# Patient Record
Sex: Male | Born: 1985 | Race: White | Hispanic: No | Marital: Single | Smoking: Current every day smoker
Health system: Southern US, Community
[De-identification: ages and names within clinical notes are randomized; demographics above are authoritative.]

## PROBLEM LIST (undated history)

## (undated) HISTORY — PX: HERNIA REPAIR: SHX51

---
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
## 2011-05-18 ENCOUNTER — Emergency Department (HOSPITAL_COMMUNITY)
Admission: EM | Admit: 2011-05-18 | Discharge: 2011-05-19 | Disposition: A | Discharge status: Other Acute Inpatient Hospital | Payer: 59 | Attending: Emergency Medicine | Admitting: Emergency Medicine

## 2011-05-18 DIAGNOSIS — X789XXA Intentional self-harm by unspecified sharp object, initial encounter: Secondary | ICD-10-CM | POA: Insufficient documentation

## 2011-05-18 DIAGNOSIS — F1010 Alcohol abuse, uncomplicated: Secondary | ICD-10-CM | POA: Insufficient documentation

## 2011-05-18 DIAGNOSIS — F329 Major depressive disorder, single episode, unspecified: Secondary | ICD-10-CM | POA: Insufficient documentation

## 2011-05-18 DIAGNOSIS — S51809A Unspecified open wound of unspecified forearm, initial encounter: Secondary | ICD-10-CM | POA: Insufficient documentation

## 2011-05-18 DIAGNOSIS — F3289 Other specified depressive episodes: Secondary | ICD-10-CM | POA: Insufficient documentation

## 2011-05-18 LAB — CBC
HCT: 48.1 % (ref 39.0–52.0)
Hemoglobin: 17.4 g/dL — ABNORMAL HIGH (ref 13.0–17.0)
MCH: 31 pg (ref 26.0–34.0)
MCHC: 36.2 g/dL — ABNORMAL HIGH (ref 30.0–36.0)
MCV: 85.7 fL (ref 78.0–100.0)
Platelets: 294 10*3/uL (ref 150–400)
RBC: 5.61 MIL/uL (ref 4.22–5.81)
RDW: 12.7 % (ref 11.5–15.5)
WBC: 10.7 10*3/uL — ABNORMAL HIGH (ref 4.0–10.5)

## 2011-05-18 LAB — COMPREHENSIVE METABOLIC PANEL
ALT: 16 U/L (ref 0–53)
AST: 19 U/L (ref 0–37)
Albumin: 4.5 g/dL (ref 3.5–5.2)
Alkaline Phosphatase: 115 U/L (ref 39–117)
BUN: 11 mg/dL (ref 6–23)
CO2: 26 mEq/L (ref 19–32)
Calcium: 9.5 mg/dL (ref 8.4–10.5)
Chloride: 103 mEq/L (ref 96–112)
Creatinine, Ser: 0.94 mg/dL (ref 0.50–1.35)
GFR calc Af Amer: >60 mL/min (ref >60)
GFR calc non Af Amer: >60 mL/min (ref >60)
Glucose, Bld: 98 mg/dL (ref 70–99)
Potassium: 3.9 mEq/L (ref 3.5–5.1)
Sodium: 141 mEq/L (ref 135–145)
Total Bilirubin: 0.2 mg/dL — ABNORMAL LOW (ref 0.3–1.2)
Total Protein: 8.1 g/dL (ref 6.0–8.3)

## 2011-05-18 LAB — DIFFERENTIAL
Basophils Absolute: 0 10*3/uL (ref 0.0–0.1)
Basophils Relative: 0 % (ref 0–1)
Eosinophils Absolute: 0.2 10*3/uL (ref 0.0–0.7)
Eosinophils Relative: 2 % (ref 0–5)
Lymphocytes Relative: 33 % (ref 12–46)
Lymphs Abs: 3.6 10*3/uL (ref 0.7–4.0)
Monocytes Absolute: 0.8 10*3/uL (ref 0.1–1.0)
Monocytes Relative: 8 % (ref 3–12)
Neutro Abs: 6.1 10*3/uL (ref 1.7–7.7)
Neutrophils Relative %: 57 % (ref 43–77)

## 2011-05-18 LAB — ETHANOL: Alcohol, Ethyl (B): 264 mg/dL — ABNORMAL HIGH (ref 0–11)

## 2011-05-19 ENCOUNTER — Inpatient Hospital Stay (HOSPITAL_COMMUNITY)
Admission: AD | Admit: 2011-05-19 | Discharge: 2011-05-24 | DRG: 897 | Disposition: A | Discharge status: Home / Self Care | Payer: 59 | Source: Ambulatory Visit | Attending: Psychiatry | Admitting: Psychiatry

## 2011-05-19 DIAGNOSIS — X838XXA Intentional self-harm by other specified means, initial encounter: Secondary | ICD-10-CM

## 2011-05-19 DIAGNOSIS — S51809A Unspecified open wound of unspecified forearm, initial encounter: Secondary | ICD-10-CM

## 2011-05-19 DIAGNOSIS — F1994 Other psychoactive substance use, unspecified with psychoactive substance-induced mood disorder: Secondary | ICD-10-CM

## 2011-05-19 DIAGNOSIS — F1210 Cannabis abuse, uncomplicated: Secondary | ICD-10-CM

## 2011-05-19 DIAGNOSIS — F1020 Alcohol dependence, uncomplicated: Principal | ICD-10-CM

## 2011-05-19 DIAGNOSIS — F17200 Nicotine dependence, unspecified, uncomplicated: Secondary | ICD-10-CM

## 2011-05-19 DIAGNOSIS — Z560 Unemployment, unspecified: Secondary | ICD-10-CM

## 2011-05-19 LAB — RAPID URINE DRUG SCREEN, HOSP PERFORMED
Amphetamines: NOT DETECTED
Barbiturates: NOT DETECTED
Opiates: NOT DETECTED
Tetrahydrocannabinol: POSITIVE — AB

## 2011-05-20 DIAGNOSIS — F1020 Alcohol dependence, uncomplicated: Secondary | ICD-10-CM

## 2011-05-21 DIAGNOSIS — F1020 Alcohol dependence, uncomplicated: Secondary | ICD-10-CM

## 2011-05-26 NOTE — Assessment & Plan Note (Signed)
[name]            ACCOUNT NO.:  [id number]  MEDICAL RECORD NO.:  [id number]  LOCATION:                                FACILITY:  [facility]  PHYSICIAN:  Orson Aloe, MD       DATE OF BIRTH:  04/11/1986  DATE OF ADMISSION:  05/19/2011 DATE OF DISCHARGE:                      PSYCHIATRIC ADMISSION ASSESSMENT   This is a 25 year old, single white male.  He presented in the company of some family members to the emergency room at [facility].  He acknowledged that he has been using alcohol heavily.  He cut himself on both of his arms and his legs with a box cutter.  He states that he was not suicidal, but he had been drinking and he cut himself because he is "a failure."  He acknowledges that he does get out of control when drinking.  He does have blackouts.  People tell him later what he has done and he has no recollection of this.  He is also currently on unsupervised probation for a DUI.  He was given the DUI in 09/07/10, last December.  He was sentenced in February and his license was revoked. He is allowed to drive at all.  PAST PSYCHIATRIC HISTORY:  He was started at the [facility] in June, although he states he has not been going.  SOCIAL HISTORY:  He is a high Garment/textile technologist in 2008, graduated from the [address] [employer] [place].  He was supposed to have started a fall semester at [employer], but he missed the deadline.  He reports that right now he feels "crazy" because he is not earning any money.  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  He got fired from this as well.  He has had other under the table jobs in [employer] and he has worked in some capacity at work. Currently, he is living with his parents and has no income.  FAMILY HISTORY:  He denies alcohol and drug history.  He apparently has been using alcohol and marijuana since he was 17.  He states he  drinks three to four times a week.  Once he gets to liquor it is all over.  His liquor of choice is Vodka, but he normally starts out with beer. Primary care provider is none.  Medical problems:  He has no known problems.  MEDICATIONS:  He is not prescribed any.  DRUG ALLERGIES:  He has no known drug allergies.  Positive physical findings:  He was seen in the ED at [facility].  He was afebrile 98.3.  His pulse was 84-94, respirations were 14-22, blood pressure 118/77 to 133/76.  His alcohol level was exuberant at 264.  His WBC was elevated at 10.7, hemoglobin was concentrated at 17.4 as was hematocrit 48.1.  Urine drug screen was positive for marijuana and he had no other labs drawn.  He sustained seven stitches to his left forearm so the other is 14 or so stitches were on several cuts on his right forearm.  They are covered and I do not have a good  visual on that at the moment.  MENTAL STATUS EXAM:  He is alert and oriented.  He has a ring through his nose.  His speech is not pressured.  His mood is appropriate. Thought processes were clear, rational and goal oriented.  He realizes that he needs to straighten up.  He does not quite know how.  Judgment and insight are poor.  Concentration and memory are intact. Intelligence is at least average.  Axis I:  Diagnosis is substance abuse induced mood disorder, alcohol abuse, rule out dependence and cannabis abuse. Axis II:  Deferred. Axis III:  Self-inflicted lacerations to both forearms while intoxicated requiring suturing. Axis IV:  Moderate stressors, economic. Axis V:  40.  He is also on unsupervised probation for a DUI and cannot drive.  PLAN:  The plan is to admit for safety and stabilization.  He will be assisted with a medical detox from his alcohol through the use of the low-dose Librium protocol.  He denies any history for tremor or DTs although he does have blackouts.  Further treatment for his alcohol abuse will be  discussed with him through Dr. Dan Humphreys in the morning, and estimated length of stay is 3-5 days.     Mickie Leonarda Salon, P.A.-C.   ______________________________ Orson Aloe, MD    MD/MEDQ  D:  05/19/2011  T:  05/19/2011  Job:  [id number]  Electronically Signed by Jaci Lazier ADAMS P.A.-C. on 05/21/2011 12:06:28 PM Electronically Signed by Orson Aloe  on 05/26/2011 04:30:51 PM

## 2011-06-10 NOTE — Discharge Summary (Signed)
  [name], [name]            ACCOUNT NO.:  [id number]  MEDICAL RECORD NO.:  [id number]  LOCATION:  0303                          FACILITY:  BH  PHYSICIAN:  Orson Aloe, MD       DATE OF BIRTH:  1986-04-25  DATE OF ADMISSION:  05/19/2011 DATE OF DISCHARGE:  05/24/2011                              DISCHARGE SUMMARY   IDENTIFYING INFORMATION:  This is a 25 year old single Caucasian male. This is a voluntary admission.  HISTORY OF PRESENT ILLNESS:  [name] presented by way of our emergency room complaining that he needed help with alcohol abuse and had been drinking heavily.  He had made cuts on both of his arms and legs with a box cutter and described himself as a failure because he had been drinking so much.  Admitted that he had a history of blackouts and that his behavior gets out of control when he drinks heavily.  He had recent DWI on September 07, 2010 and is on supervised probation.  His license was revoked in February 2012.  He had attended the [facility] through June 2012 and had stopped going.  MEDICAL EVALUATION:  He was medically evaluated in our emergency room where his alcohol screen was noted to be 264 mg/dL.  He was afebrile with a pulse of 94 and blood pressure 133/76.  CBC was noted to be normal.  Urine drug screen positive for cannabis metabolites.  He required sutures on both arms for the cuts.  COURSE OF HOSPITALIZATION:  He was admitted to our dual diagnosis unit and started on a Librium protocol with a goal of a safe detox in 4 days. He was given a provisional diagnosis of alcohol dependence, cannabis abuse and substance-induced mood disorder.  He was also given Vistaril 50 mg at bedtime p.r.n. for insomnia.  He was sleepy and reclusive his first 24 hours here at [facility] but was appropriate with staff and peers throughout his stay.  By May 22, 2011 he was fully alert, attending groups and cooperative and consistently denying any  suicidal thoughts.  He also denied any acute withdrawal or cravings.  Sleep and appetite were good.  He expressed interest in residential treatment after discharge and worked with our case Production designer, theatre/television/film, ultimately elected to go to [facility] who accepted him for further treatment.  Discharge plan is to follow up at [facility] and family was going to transfer him at discharge.  DISCHARGE DIAGNOSES:  Axis I:  Alcohol dependence, substance-induced mood disorder. Axis II:  Deferred. Axis III:  Multiple self-inflicted lacerations sutured and healing. Axis IV:  Moderate chronic social issues. Axis V:  Current 55, past year not known.  DISCHARGE CONDITION:  Stable and improved.  DISCHARGE MEDICATIONS: 1. Bacitracin ointment applied topically to suture lines as needed     till healed. 2. Keflex 500 mg twice daily to support healing of lacerations.     Margaret A. Lorin Picket, N.P.   ______________________________ Orson Aloe, MD    MAS/MEDQ  D:  06/09/2011  T:  06/09/2011  Job:  [id number]  Electronically Signed by Orson Aloe  on 06/10/2011 08:30:47 AM

## 2012-05-15 ENCOUNTER — Emergency Department (HOSPITAL_COMMUNITY): Payer: 59

## 2012-05-15 ENCOUNTER — Encounter (HOSPITAL_COMMUNITY): Payer: Self-pay | Admitting: Emergency Medicine

## 2012-05-15 ENCOUNTER — Emergency Department (HOSPITAL_COMMUNITY)
Admission: EM | Admit: 2012-05-15 | Discharge: 2012-05-15 | Disposition: A | Discharge status: Home / Self Care | Payer: 59 | Attending: Emergency Medicine | Admitting: Emergency Medicine

## 2012-05-15 DIAGNOSIS — S335XXA Sprain of ligaments of lumbar spine, initial encounter: Secondary | ICD-10-CM

## 2012-05-15 DIAGNOSIS — W19XXXA Unspecified fall, initial encounter: Secondary | ICD-10-CM

## 2012-05-15 DIAGNOSIS — F17200 Nicotine dependence, unspecified, uncomplicated: Secondary | ICD-10-CM | POA: Insufficient documentation

## 2012-05-15 DIAGNOSIS — S239XXA Sprain of unspecified parts of thorax, initial encounter: Secondary | ICD-10-CM

## 2012-05-15 DIAGNOSIS — W010XXA Fall on same level from slipping, tripping and stumbling without subsequent striking against object, initial encounter: Secondary | ICD-10-CM | POA: Insufficient documentation

## 2012-05-15 MED ORDER — METHOCARBAMOL 500 MG PO TABS: 500.0000 mg | ORAL_TABLET | Freq: Two times a day (BID) | ORAL | Status: AC

## 2012-05-15 MED ORDER — DIAZEPAM 5 MG PO TABS
5.0000 mg | ORAL_TABLET | Freq: Once | ORAL | Status: AC
  Administered 2012-05-15: 5 mg via ORAL
  Filled 2012-05-15: qty 1

## 2012-05-15 MED ORDER — OXYCODONE-ACETAMINOPHEN 5-325 MG PO TABS
2.0000 | ORAL_TABLET | Freq: Once | ORAL | Status: AC
  Administered 2012-05-15: 2 via ORAL
  Filled 2012-05-15: qty 2

## 2012-05-15 MED ORDER — OXYCODONE-ACETAMINOPHEN 5-325 MG PO TABS: 1.0000 | ORAL_TABLET | Freq: Four times a day (QID) | ORAL | Status: AC | PRN

## 2012-05-15 NOTE — ED Notes (Signed)
PT. TRIPPED AND LOST HIS BALANCED WHILE GOING DOWN STEPS AT HOME THIS EVENING , REPORTS PAIN AT MID/LOW BACK PAIN WORSE WITH MOVEMENT/CERTAIN POSITIONS.

## 2012-05-15 NOTE — ED Notes (Signed)
Pt in xray

## 2012-05-15 NOTE — ED Notes (Signed)
Prescriptions given with discharge instructions.  

## 2012-05-15 NOTE — ED Provider Notes (Signed)
History     CSN: [id number]  Arrival date & time 05/15/12  1956   First MD Initiated Contact with Patient 05/15/12 2146      Chief Complaint  Patient presents with  . Fall   HPI  History provided by the patient. Patient is a 27 year old male with no significant PMH who presents after a fall with complaints of back pain and injury. Patient states that he was carrying a large heavy "tote" when he slipped and fell. Patient states he first hit his left knee on the step and then hyperflexed his back with tote causing him to curl up into a "C". Since that time patient complains of severe pain in the mid and low back area. Pain is worse with any types of movements. Patient has been ambulatory but states he has significant pains in the back with movements and walking. He denies any pain in the knee and reports normal range of motion. There was no lacerations, abrasions or bleeding. Patient denies having any LOC. Patient has not used anything for his treatments. Pain does not radiate. He denies any numbness or weakness in lower extremities. No urinary or fecal incontinence.    History reviewed. No pertinent past medical history.  History reviewed. No pertinent past surgical history.  No family history on file.  History  Substance Use Topics  . Smoking status: Current Everyday Smoker  . Smokeless tobacco: Not on file  . Alcohol Use: Yes      Review of Systems  HENT: Negative for neck pain.   Musculoskeletal: Positive for back pain.  Neurological: Negative for dizziness, weakness, light-headedness, numbness and headaches.    Allergies  Review of patient's allergies indicates no known allergies.  Home Medications   Current Outpatient Rx  Name Route Sig Dispense Refill  . MELATONIN 5 MG PO TABS Oral Take 10 mg by mouth at bedtime.      BP 100/60  Pulse 68  Temp 98.4 F (36.9 C) (Oral)  Resp 20  SpO2 98%  Physical Exam  Nursing note and vitals reviewed. Constitutional: He  appears well-developed and well-nourished.  HENT:  Head: Normocephalic.  Cardiovascular: Normal rate and regular rhythm.   Pulmonary/Chest: Effort normal and breath sounds normal.  Abdominal: Soft.  Musculoskeletal:       Cervical back: Normal.       Thoracic back: He exhibits tenderness. He exhibits no bony tenderness.       Lumbar back: He exhibits tenderness.       Back:    ED Course  Procedures   Dg Thoracic Spine 2 View  05/15/2012  *RADIOLOGY REPORT*  Clinical Data: Fall, pain.  THORACIC SPINE - 2 VIEW  Comparison: None.  Findings: Vertebral body height and alignment are normal. Paraspinous structures appear normal.  IMPRESSION: Negative study.   Original Report Authenticated By: Bernadene Bell. D'ALESSIO, M.D.    Dg Lumbar Spine Complete  05/15/2012  *RADIOLOGY REPORT*  Clinical Data: Fall.  Back pain.  LUMBAR SPINE - COMPLETE 4+ VIEW  Comparison: None.  Findings: Vertebral body height and alignment are normal. Intervertebral disc space height is maintained.  No pars interarticularis defect is identified.  Paraspinous structures are unremarkable.  IMPRESSION: Normal study.   Original Report Authenticated By: Bernadene Bell. D'ALESSIO, M.D.      1. Fall   2. Lumbar back sprain   3. Thoracic back sprain       MDM  10:15 PM patient seen and evaluated. Patient moderate discomfort. X-rays negative today.  Patient is ambulatory. Will treat symptomatically.      Angus Seller, [place] 05/15/12 [phone]

## 2012-05-16 NOTE — ED Provider Notes (Signed)
Medical screening examination/treatment/procedure(s) were performed by non-physician practitioner and as supervising physician I was immediately available for consultation/collaboration.   Carleene Cooper III, MD 05/16/12 [phone]

## 2014-04-07 IMAGING — CR DG THORACIC SPINE 2V
4 series · 4 of 4 positions shown · non-contrast
Comparison: None.

CLINICAL DATA: Fall, pain.

THORACIC SPINE - 2 VIEW

[t thoracic spine ap]
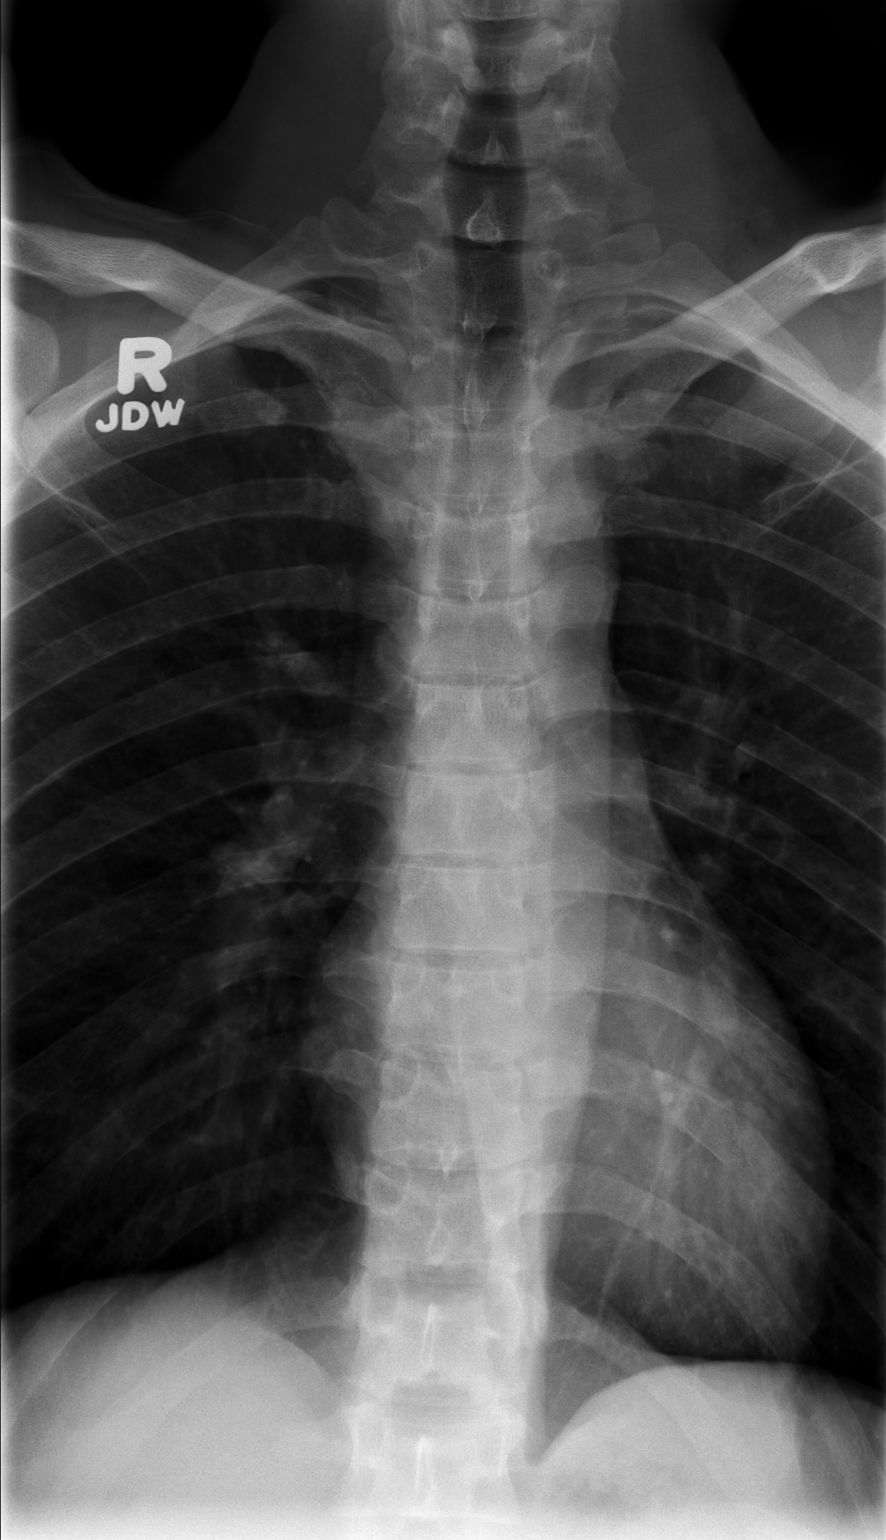

[t thoracic spine lat (1 of 2)]
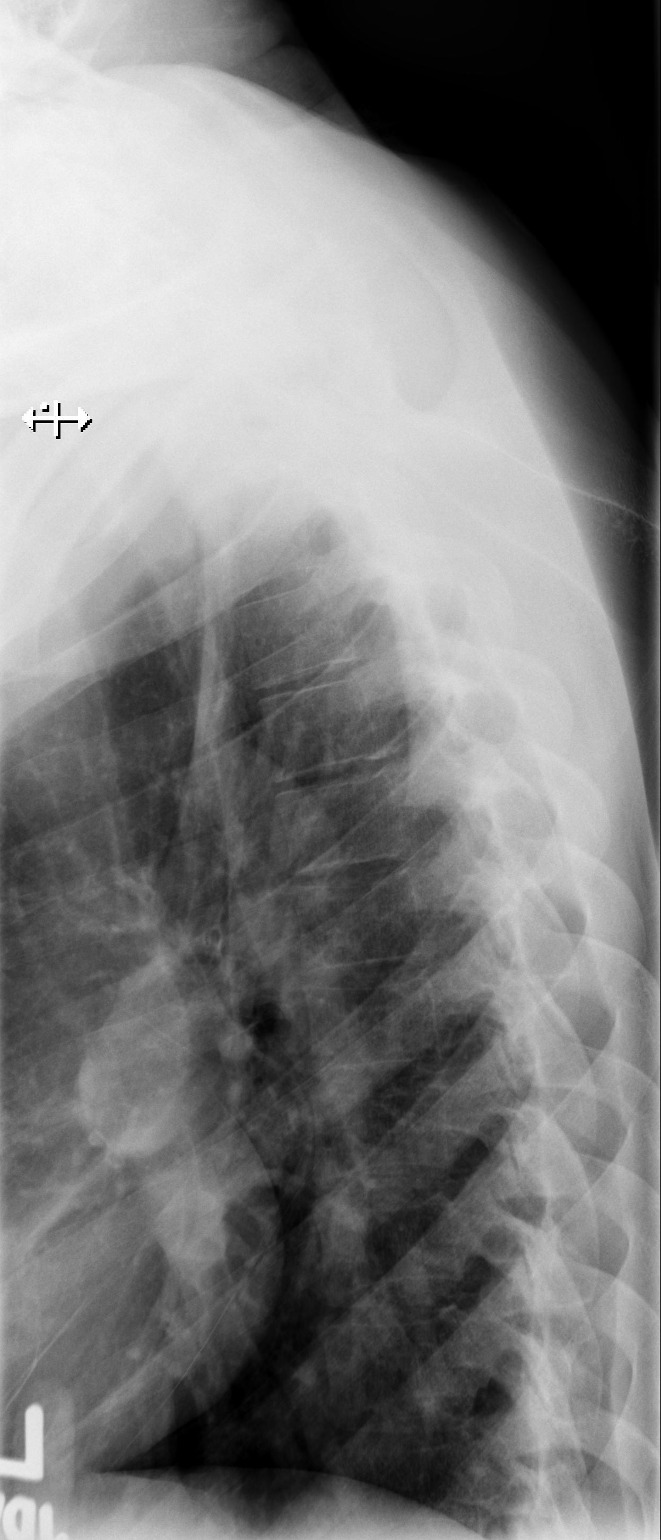

[t thoracic spine lat (2 of 2)]
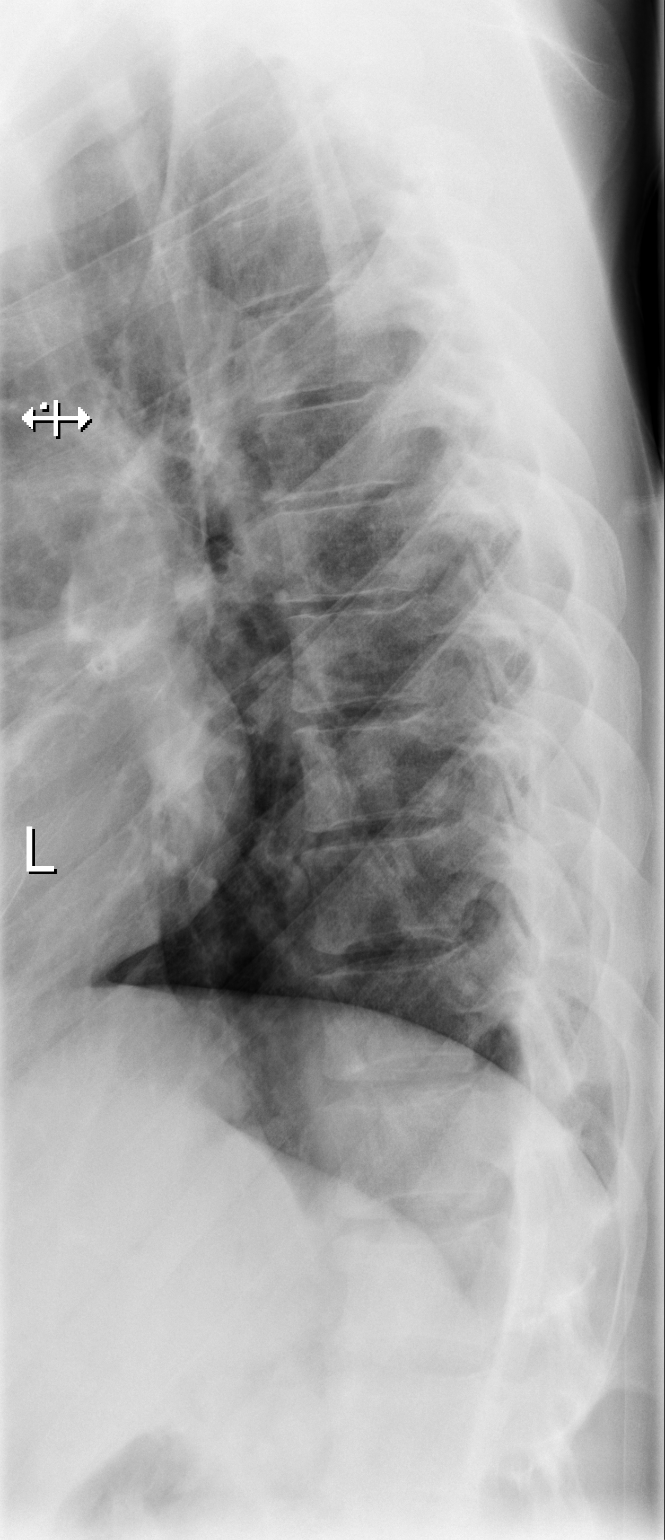

[t thoracic swimmers]
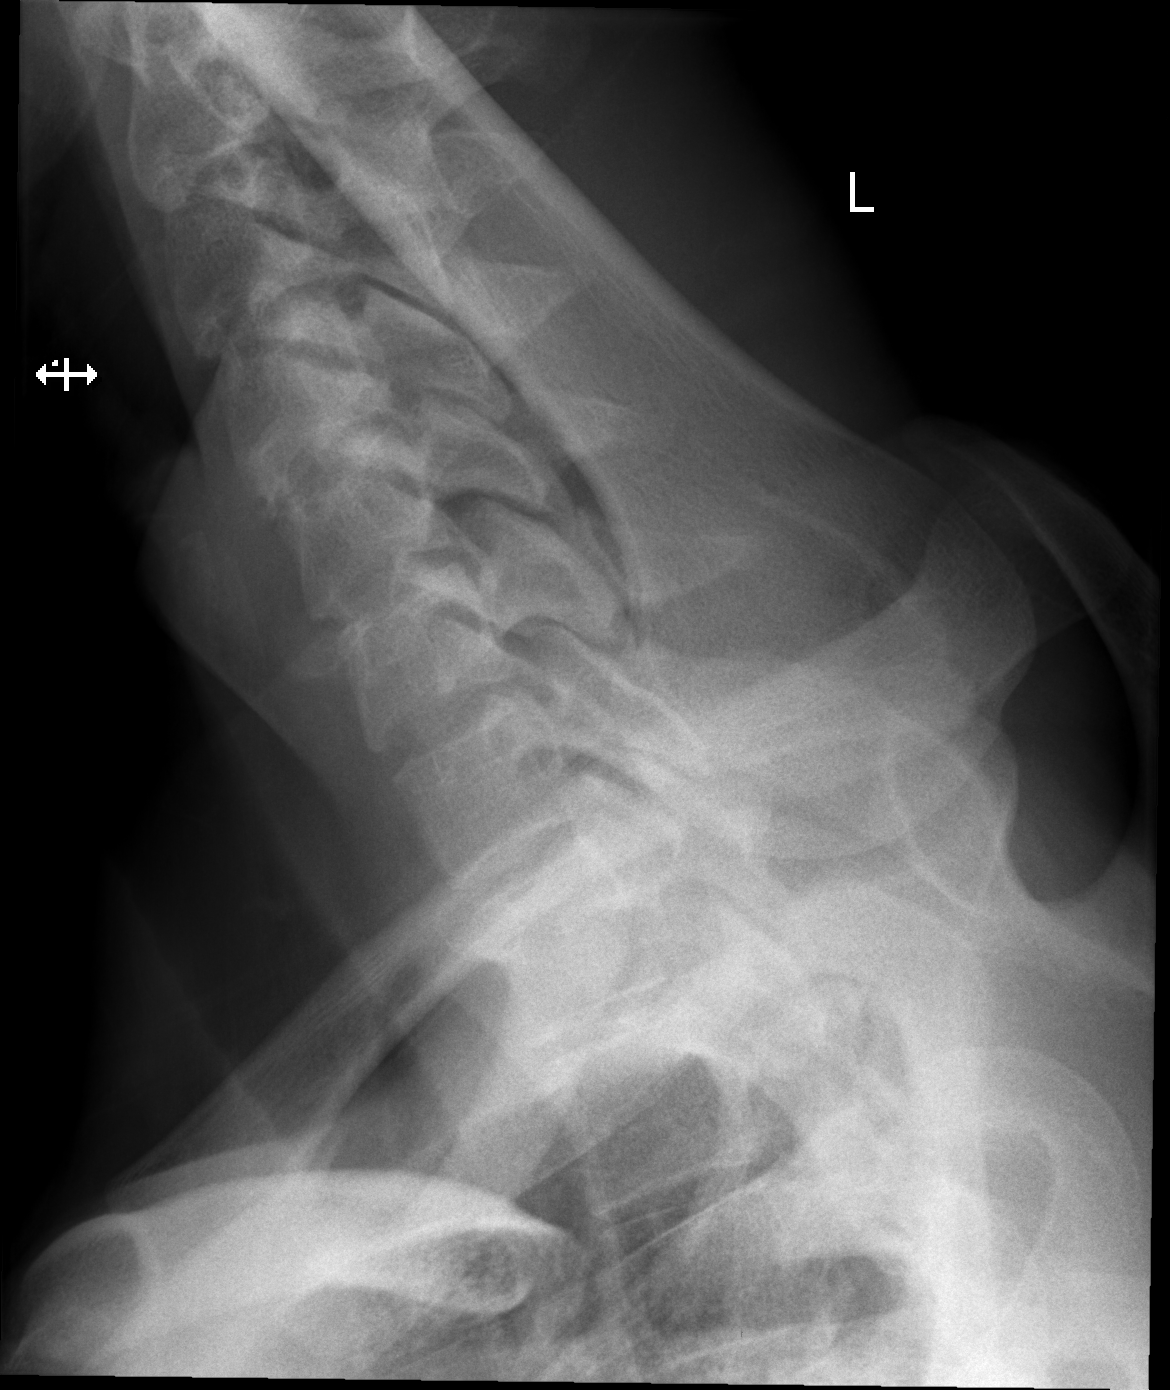

[4 of 4 positions shown; findings below may reference images not displayed]

FINDINGS: Vertebral body height and alignment are normal.
Paraspinous structures appear normal.
IMPRESSION: Negative study.

## 2015-05-01 ENCOUNTER — Emergency Department (HOSPITAL_COMMUNITY)
Admission: EM | Admit: 2015-05-01 | Discharge: 2015-05-01 | Disposition: A | Discharge status: Home / Self Care | Payer: 59 | Attending: Emergency Medicine | Admitting: Emergency Medicine

## 2015-05-01 ENCOUNTER — Encounter (HOSPITAL_COMMUNITY): Payer: Self-pay | Admitting: Emergency Medicine

## 2015-05-01 DIAGNOSIS — Y9289 Other specified places as the place of occurrence of the external cause: Secondary | ICD-10-CM | POA: Insufficient documentation

## 2015-05-01 DIAGNOSIS — Z79899 Other long term (current) drug therapy: Secondary | ICD-10-CM | POA: Insufficient documentation

## 2015-05-01 DIAGNOSIS — Y990 Civilian activity done for income or pay: Secondary | ICD-10-CM | POA: Insufficient documentation

## 2015-05-01 DIAGNOSIS — S51811A Laceration without foreign body of right forearm, initial encounter: Secondary | ICD-10-CM | POA: Insufficient documentation

## 2015-05-01 DIAGNOSIS — W01198A Fall on same level from slipping, tripping and stumbling with subsequent striking against other object, initial encounter: Secondary | ICD-10-CM | POA: Insufficient documentation

## 2015-05-01 DIAGNOSIS — Z720 Tobacco use: Secondary | ICD-10-CM | POA: Insufficient documentation

## 2015-05-01 DIAGNOSIS — Y9389 Activity, other specified: Secondary | ICD-10-CM | POA: Insufficient documentation

## 2015-05-01 MED ORDER — LIDOCAINE HCL (PF) 1 % IJ SOLN
30.0000 mL | Freq: Once | INTRAMUSCULAR | Status: AC
  Administered 2015-05-01: 30 mL
  Filled 2015-05-01: qty 30

## 2015-05-01 NOTE — ED Provider Notes (Signed)
CSN: [id number]     Arrival date & time 05/01/15  0013 History   This chart was scribed for Dione Booze, MD by Arlan Organ, ED Scribe. This patient was seen in room A10C/A10C and the patient's care was started 2:04 AM.   Chief Complaint  Patient presents with  . Extremity Laceration    The history is provided by the patient. No language interpreter was used.    HPI Comments: [name] is a 29 y.o. male without any pertinent past medical history who presents to the Emergency Department here after sustaining a laceration to the R forearm a few hours prior to arrival. Pt states he tripped and hit his arm against a metal plate while at work resulting in a large open wound. Bleeding is controlled at this time. No cleaning techniques attempted prior to arrival. However, pt presents with forearm wrapped in gauze padding. No recent fever or chills. No weakness, loss of sensation, or numbness to arm. No known allergies to medications.  History reviewed. No pertinent past medical history. Past Surgical History  Procedure Laterality Date  . Hernia repair     No family history on file. Social History  Substance Use Topics  . Smoking status: Current Every Day Smoker  . Smokeless tobacco: None  . Alcohol Use: Yes    Review of Systems  Constitutional: Negative for fever and chills.  Respiratory: Negative for shortness of breath.   Cardiovascular: Negative for chest pain.  Gastrointestinal: Negative for nausea, vomiting and abdominal pain.  Skin: Positive for wound.  Psychiatric/Behavioral: Negative for confusion.  All other systems reviewed and are negative.     Allergies  Review of patient's allergies indicates no known allergies.  Home Medications   Prior to Admission medications   Medication Sig Start Date End Date Taking? Authorizing Provider  Melatonin 5 MG TABS Take 10 mg by mouth at bedtime.    Historical Provider, MD   Triage Vitals: BP 133/76 mmHg  Pulse 72   Temp(Src) 98.1 F (36.7 C)  Ht  (1.778 m)  Wt 160 lb (72.576 kg)  BMI 22.96 kg/m2  SpO2 96%   Physical Exam  Constitutional: He is oriented to person, place, and time. He appears well-developed and well-nourished.  HENT:  Head: Normocephalic and atraumatic.  Eyes: EOM are normal. Pupils are equal, round, and reactive to light.  Neck: Normal range of motion. Neck supple. No JVD present.  Cardiovascular: Normal rate, regular rhythm and normal heart sounds.   No murmur heard. Pulmonary/Chest: Effort normal and breath sounds normal. He has no wheezes. He has no rales. He exhibits no tenderness.  Abdominal: Soft. Bowel sounds are normal. He exhibits no distension and no mass. There is no tenderness.  Musculoskeletal: Normal range of motion. He exhibits no edema.  Long laceration noted to ulnar aspect of R forearm  Lymphadenopathy:    He has no cervical adenopathy.  Neurological: He is alert and oriented to person, place, and time. No cranial nerve deficit. He exhibits normal muscle tone. Coordination normal.  Skin: Skin is warm and dry. No rash noted.  Psychiatric: He has a normal mood and affect. His behavior is normal. Judgment and thought content normal.  Nursing note and vitals reviewed.   ED Course  Procedures (including critical care time)  DIAGNOSTIC STUDIES: Oxygen Saturation is 96% on RA, adequate by my interpretation.    COORDINATION OF CARE: 2:08 AM- Will perform laceration repair. Discussed treatment plan with pt at bedside and pt agreed  to plan.     LACERATION REPAIR Performed by: Dione Booze MD Consent: Verbal consent obtained. Risks and benefits: risks, benefits and alternatives were discussed Patient identity confirmed: provided demographic data Time out performed prior to procedure Prepped and Draped in normal sterile fashion Wound explored Laceration Location: Ulnar aspect of R forearm Laceration Length: 13 cm No Foreign Bodies seen or  palpated Anesthesia: local infiltration Local anesthetic: lidocaine 1% without epinephrine Anesthetic total: 7 ml Amount of cleaning: standard Skin closure: 4-0 Nylon Number of sutures: 1 running suture Technique: Running Mattress Stitch Patient tolerance: Patient tolerated the procedure well with no immediate complications.    MDM   Final diagnoses:  Laceration of right forearm, initial encounter    Laceration of right forearm closed with sutures.  I personally performed the services described in this documentation, which was scribed in my presence. The recorded information has been reviewed and is accurate.     Dione Booze, MD 05/01/15 [phone]

## 2015-05-01 NOTE — ED Notes (Signed)
Pt A&OX4, ambulatory at d/c with steady gait, NAD. States he has all of his belongings with him at discharge.

## 2015-05-01 NOTE — ED Notes (Signed)
Suture cart at bedside 

## 2015-05-01 NOTE — ED Notes (Signed)
Pt. accidentally tripped and hit arm against a metal plate while at work ( [employer]) this evening , reports laceration approx. 3" at right forearm , dressing applied prior to arrival .

## 2015-05-01 NOTE — Discharge Instructions (Signed)
Please go to your Primary Care Physician, an Urgent Care or return to the Emergency Department to have your staples or sutures removed 7 -10 days from today.  Laceration Care, Adult A laceration is a cut or lesion that goes through all layers of the skin and into the tissue just beneath the skin. TREATMENT  Some lacerations may not require closure. Some lacerations may not be able to be closed due to an increased risk of infection. It is important to see your caregiver as soon as possible after an injury to minimize the risk of infection and maximize the opportunity for successful closure. If closure is appropriate, pain medicines may be given, if needed. The wound will be cleaned to help prevent infection. Your caregiver will use stitches (sutures), staples, wound glue (adhesive), or skin adhesive strips to repair the laceration. These tools bring the skin edges together to allow for faster healing and a better cosmetic outcome. However, all wounds will heal with a scar. Once the wound has healed, scarring can be minimized by covering the wound with sunscreen during the day for 1 full year. HOME CARE INSTRUCTIONS  For sutures or staples:  Keep the wound clean and dry.  If you were given a bandage (dressing), you should change it at least once a day. Also, change the dressing if it becomes wet or dirty, or as directed by your caregiver.  Wash the wound with soap and water 2 times a day. Rinse the wound off with water to remove all soap. Pat the wound dry with a clean towel.  After cleaning, apply a thin layer of the antibiotic ointment as recommended by your caregiver. This will help prevent infection and keep the dressing from sticking.  You may shower as usual after the first 24 hours. Do not soak the wound in water until the sutures are removed.  Only take over-the-counter or prescription medicines for pain, discomfort, or fever as directed by your caregiver.  Get your sutures or staples  removed as directed by your caregiver. For skin adhesive strips:  Keep the wound clean and dry.  Do not get the skin adhesive strips wet. You may bathe carefully, using caution to keep the wound dry.  If the wound gets wet, pat it dry with a clean towel.  Skin adhesive strips will fall off on their own. You may trim the strips as the wound heals. Do not remove skin adhesive strips that are still stuck to the wound. They will fall off in time. For wound adhesive:  You may briefly wet your wound in the shower or bath. Do not soak or scrub the wound. Do not swim. Avoid periods of heavy perspiration until the skin adhesive has fallen off on its own. After showering or bathing, gently pat the wound dry with a clean towel.  Do not apply liquid medicine, cream medicine, or ointment medicine to your wound while the skin adhesive is in place. This may loosen the film before your wound is healed.  If a dressing is placed over the wound, be careful not to apply tape directly over the skin adhesive. This may cause the adhesive to be pulled off before the wound is healed.  Avoid prolonged exposure to sunlight or tanning lamps while the skin adhesive is in place. Exposure to ultraviolet light in the first year will darken the scar.  The skin adhesive will usually remain in place for 5 to 10 days, then naturally fall off the skin. Do not pick  at the adhesive film. You may need a tetanus shot if:  You cannot remember when you had your last tetanus shot.  You have never had a tetanus shot. If you get a tetanus shot, your arm may swell, get red, and feel warm to the touch. This is common and not a problem. If you need a tetanus shot and you choose not to have one, there is a rare chance of getting tetanus. Sickness from tetanus can be serious. SEEK MEDICAL CARE IF:   You have redness, swelling, or increasing pain in the wound.  You see a red line that goes away from the wound.  You have  yellowish-white fluid (pus) coming from the wound.  You have a fever.  You notice a bad smell coming from the wound or dressing.  Your wound breaks open before or after sutures have been removed.  You notice something coming out of the wound such as wood or glass.  Your wound is on your hand or foot and you cannot move a finger or toe. SEEK IMMEDIATE MEDICAL CARE IF:   Your pain is not controlled with prescribed medicine.  You have severe swelling around the wound causing pain and numbness or a change in color in your arm, hand, leg, or foot.  Your wound splits open and starts bleeding.  You have worsening numbness, weakness, or loss of function of any joint around or beyond the wound.  You develop painful lumps near the wound or on the skin anywhere on your body. MAKE SURE YOU:   Understand these instructions.  Will watch your condition.  Will get help right away if you are not doing well or get worse. Document Released: 09/05/2005 Document Revised: 11/28/2011 Document Reviewed: 03/01/2011 [facility] Patient Information 2015 [place], [place]. This information is not intended to replace advice given to you by your health care provider. Make sure you discuss any questions you have with your health care provider.

## 2018-02-16 DIAGNOSIS — G5622 Lesion of ulnar nerve, left upper limb: Secondary | ICD-10-CM | POA: Insufficient documentation

## 2023-03-29 ENCOUNTER — Ambulatory Visit: Payer: 59 | Admitting: Internal Medicine

## 2023-05-02 ENCOUNTER — Ambulatory Visit: Payer: BC Managed Care – PPO | Admitting: Internal Medicine

## 2023-05-02 ENCOUNTER — Encounter: Payer: Self-pay | Admitting: Internal Medicine

## 2023-05-02 VITALS — BP 126/88 | HR 79 | Temp 98.2°F | Ht 70.0 in | Wt 220.8 lb

## 2023-05-02 DIAGNOSIS — Z0282 Encounter for adoption services: Secondary | ICD-10-CM | POA: Insufficient documentation

## 2023-05-02 DIAGNOSIS — Z0000 Encounter for general adult medical examination without abnormal findings: Secondary | ICD-10-CM

## 2023-05-02 DIAGNOSIS — Z0001 Encounter for general adult medical examination with abnormal findings: Secondary | ICD-10-CM

## 2023-05-02 DIAGNOSIS — Z23 Encounter for immunization: Secondary | ICD-10-CM | POA: Diagnosis not present

## 2023-05-02 DIAGNOSIS — Z720 Tobacco use: Secondary | ICD-10-CM

## 2023-05-02 DIAGNOSIS — E669 Obesity, unspecified: Secondary | ICD-10-CM

## 2023-05-02 DIAGNOSIS — R638 Other symptoms and signs concerning food and fluid intake: Secondary | ICD-10-CM | POA: Diagnosis not present

## 2023-05-02 LAB — COMPREHENSIVE METABOLIC PANEL
ALT: 33 U/L (ref 0–53)
AST: 25 U/L (ref 0–37)
Albumin: 4.6 g/dL (ref 3.5–5.2)
Alkaline Phosphatase: 85 U/L (ref 39–117)
BUN: 23 mg/dL (ref 6–23)
CO2: 25 mEq/L (ref 19–32)
Calcium: 10.1 mg/dL (ref 8.4–10.5)
Chloride: 100 mEq/L (ref 96–112)
Creatinine, Ser: 0.82 mg/dL (ref 0.40–1.50)
GFR: 114.36 mL/min (ref >60.00)
Glucose, Bld: 105 mg/dL — ABNORMAL HIGH (ref 70–99)
Potassium: 4.8 mEq/L (ref 3.5–5.1)
Sodium: 135 mEq/L (ref 135–145)
Total Bilirubin: 0.4 mg/dL (ref 0.2–1.2)
Total Protein: 7.6 g/dL (ref 6.0–8.3)

## 2023-05-02 LAB — CBC WITH DIFFERENTIAL/PLATELET
Basophils Absolute: 0 10*3/uL (ref 0.0–0.1)
Basophils Relative: 0.3 % (ref 0.0–3.0)
Eosinophils Absolute: 0.6 10*3/uL (ref 0.0–0.7)
Eosinophils Relative: 4.3 % (ref 0.0–5.0)
HCT: 47.9 % (ref 39.0–52.0)
Hemoglobin: 15.8 g/dL (ref 13.0–17.0)
Lymphocytes Relative: 28 % (ref 12.0–46.0)
Lymphs Abs: 3.7 10*3/uL (ref 0.7–4.0)
MCHC: 32.9 g/dL (ref 30.0–36.0)
MCV: 90.6 fl (ref 78.0–100.0)
Monocytes Absolute: 0.9 10*3/uL (ref 0.1–1.0)
Monocytes Relative: 6.5 % (ref 3.0–12.0)
Neutro Abs: 8.1 10*3/uL — ABNORMAL HIGH (ref 1.4–7.7)
Neutrophils Relative %: 60.9 % (ref 43.0–77.0)
Platelets: 333 10*3/uL (ref 150.0–400.0)
RBC: 5.28 Mil/uL (ref 4.22–5.81)
RDW: 13.2 % (ref 11.5–15.5)
WBC: 13.2 10*3/uL — ABNORMAL HIGH (ref 4.0–10.5)

## 2023-05-02 LAB — LIPID PANEL
Cholesterol: 172 mg/dL (ref 0–200)
HDL: 46 mg/dL (ref >39.00)
NonHDL: 125.67
Total CHOL/HDL Ratio: 4
Triglycerides: 244 mg/dL — ABNORMAL HIGH (ref 0.0–149.0)
VLDL: 48.8 mg/dL — ABNORMAL HIGH (ref 0.0–40.0)

## 2023-05-02 LAB — LDL CHOLESTEROL, DIRECT: Direct LDL: 119 mg/dL

## 2023-05-02 LAB — TSH: TSH: 0.93 u[IU]/mL (ref 0.35–5.50)

## 2023-05-02 LAB — HEMOGLOBIN A1C: Hgb A1c MFr Bld: 5.7 % (ref 4.6–6.5)

## 2023-05-02 NOTE — Patient Instructions (Addendum)
Welcome aboard!   Today's visit was a valuable first step in understanding your health and starting your personalized care journey. We discussed your medical history and medications in detail. Given the extensive information, we prioritized addressing your most pressing concerns.  We understood those concerns to be:  New Patient (Initial Visit) and Annual Exam   Building a Complete Picture  To create the most effective care plan possible, we may need additional information from previous providers. We encouraged you to gather any relevant medical records for your next visit. This will help [place] build a more complete picture and develop a personalized plan together. In the meantime, we'll address your immediate concerns and provide resources to help you manage all of your medical issues.  We encourage you to use MyChart to review these efforts, and to help [place] find and correct any omissions or errors in your medical chart.  Managing Your Health Over Time  Managing every aspect of your health in a single visit isn't always feasible, but that's okay.  We addressed your most pressing concerns today and charted a course for future care. Acute conditions or preventive care measures may require further attention.  We encourage you to schedule a follow-up visit at your earliest convenience to discuss any unresolved issues.  We strongly encourage participation in annual preventive care visits to help [place] develop a more thorough understanding of your health and to help you maintain optimal wellness - please inquire about scheduling your next one with [place] at your earliest convenience.  Your Satisfaction Matters  It was a pleasure seeing you today!  Your health and satisfaction will always be my top priorities. If you believe your experience today was worthy of a 5-star rating, I'd be grateful for your feedback!  Lula Olszewski, MD   Next Steps  Schedule Follow-Up:  We recommend a follow-up appointment in  Return in about 1 year (around 05/01/2024) for annual preventive care visit. If your condition worsens before then, please call us or seek emergency care. Preventive Care:  Don't forget to schedule your annual preventive care visit!  This important checkup is typically covered by insurance and helps identify potential health issues early.  Typically its 100% insurance covered with no co-pay and helps to get surveillance labwork paid for through your insurance provider.  Sometimes it even lowers your insurance premiums to participate. Medical Information Release:  For any relevant medical information we don't have, please sign a release form so we can obtain it for your records. Lab & X-ray Appointments:  Scheduled any incomplete lab tests today or call us to schedule.  X-Rays can be done without an appointment at [facility] ([address], [address]), M-F 8:30am-noon or 1pm-5pm.  Just tell them you're there for X-rays ordered by Dr. Jon Billings.  We'll receive the results and contact you by phone or MyChart to discuss next steps.  Bring to Your Next Appointment  Medications: Please bring all your medication bottles to your next appointment to ensure we have an accurate record of your prescriptions. Health Diaries: If you're monitoring any health conditions at home, keeping a diary of your readings can be very helpful for discussions at your next appointment.  Reviewing Your Records  Please Review this early draft of your clinical notes below and the final encounter summary tomorrow on MyChart after its been completed.   Preventative health care -     Comprehensive metabolic panel -     Lipid panel -  Hemoglobin A1c -     TSH -     CBC with Differential/Platelet  Need for Tdap vaccination -     Tdap vaccine greater than or equal to 7yo IM  Weight disorder -     Comprehensive metabolic panel -     Lipid panel -     Hemoglobin A1c -     TSH -     CBC with  Differential/Platelet  Obesity (BMI 30-39.9)  Encounter for annual general medical examination with abnormal findings in adult  Tobacco use  Adopted  Other orders -     LDL cholesterol, direct     Getting Answers and Following Up  Simple Questions & Concerns: For quick questions or basic follow-up after your visit, reach [place] at [phone] or MyChart messaging. Complex Concerns: If your concern is more complex, scheduling an appointment might be best. Discuss this with the staff to find the most suitable option. Lab & Imaging Results: We'll contact you directly if results are abnormal or you don't use MyChart. Most normal results will be on MyChart within 2-3 business days, with a review message from Dr. Jon Billings. Haven't heard back in 2 weeks? Need results sooner? Contact us at [phone]. Referrals: Our referral coordinator will manage specialist referrals. The specialist's office should contact you within 2 weeks to schedule an appointment. Call us if you haven't heard from them after 2 weeks.  Staying Connected  MyChart: Activate your MyChart for the fastest way to access results and message [place]. See the last page of this paperwork for instructions.  Billing  X-ray & Lab Orders: These are billed by separate companies. Contact the invoicing company directly for questions or concerns. Visit Charges: Discuss any billing inquiries with our administrative services team.  Feedback & Satisfaction  Share Your Experience: We strive for your satisfaction! If you have any complaints, please let Dr. Jon Billings know directly or contact our Practice Administrators, Edwena Felty or [employer], by asking at the front desk.  Scheduling Tips  Shorter Wait Times: 8 am and 1 pm appointments often have the quickest wait times. Longer Appointments: If you need more time during your visit, talk to the front desk. Due to insurance regulations, multiple back-to-back appointments might be  necessary.

## 2023-05-02 NOTE — Progress Notes (Signed)
[facility]: [phone] Patient Care Team: Lula Olszewski, MD as PCP - General (Internal Medicine) Today's Healthcare Provider: Lula Olszewski, MD  Chief Complaint:  [name] is a 37 y.o. assigned male at birth who presents today for his New Patient (Initial Visit) and Annual Exam  Assessment/Plan:   Weight disorder -     Comprehensive metabolic panel -     Lipid panel -     Hemoglobin A1c -     TSH -     CBC with Differential/Platelet  Need for Tdap vaccination -     Tdap vaccine greater than or equal to 7yo IM  Preventative health care -     Comprehensive metabolic panel -     Lipid panel -     Hemoglobin A1c -     TSH -     CBC with Differential/Platelet  Obesity (BMI 30-39.9)  Encounter for annual general medical examination with abnormal findings in adult  Other orders -     LDL cholesterol, direct  Today's Health Maintenance Counseling and Anticipatory Guidance:  Eye exams:  every 1-2 years recommended.  Having vision corrected can improve the quality of day-to-day life.  Eye specialists can detect certain eye conditions such as cataracts, glaucoma and age-related macular degeneration, which could lead to sight loss.  They can also detect certain rare cancers and diabetes, among other things.  Mr.Staller reports his last eye exam was:  "god knows how long" but has vision insurance.  Dental health: Discussed importance of regular tooth brushing, flossing, and dental visits q6 months.  Poor dentition can lead to serious medical problems - particularly problems with heart valves.  Mr.Searcy reports he has not been keeping up with his dental visits.  He intends to do so in the future.  Sinus health: Encourage sterile saline nasal misting sinus rinses daily for pollen, to reduce allergies and risk for sinus infections.   Sterile can based misting products are recommended due to superior misting and ease of maintaining sterility.He  just gets water in nose in shower. Sleep Apnea screening:  He  denies any significant problems with sleep quality or hypersomnolence or being advised that he has been told that he snores or has apnea STOP-Bang Score (scored NO unless checked) [x]  Do you snore loudly []  Tired, fatigued, or sleepy during the daytime []  Witness apneas []  Significant hypertension []  BMI greater than 35    []  Age older than 35 years old [x]  Has large neck size over 15.7 in [x]  Male Total Score: 3 risk factors I recommend using [employer] App or another phone app for free   Cardiovascular Risk Factor Reduction:   Advised patient of need for regular exercise and diet rich and fruits and vegetables and healthy fats to reduce risk of heart attack and stroke.  Avoid first- and second-hand smoke and stimulants.  He smokes 1 ppd-2.5 ppd since age 74. Avoid extreme exercise- exercise in moderation (150 minutes per week is a good goal) Wt Readings from Last 3 Encounters:  05/02/23 220 lb 12.8 oz (100.2 kg)  05/01/15 160 lb (72.6 kg)  Body mass index is 31.68 kg/m. /  He reports his diet consists of likes fish salmon tuna country ribs, hamburgers hot dogs. Vegetables with starches He reports his exercise includes of does lifting now. Stretching. Health maintenance and immunizations reviewed and he was encouraged to complete anything that is due: Immunization History  Administered Date(s) Administered   Tdap  05/02/2023   There are no preventive care reminders to display for this patient. Arranged today Social History   Substance and Sexual Activity  Sexual Activity Yes   Birth control/protection: None    Substance use:  I discussed that my recommendation is total abstinence from all substances of abuse including smoke and 2nd hand smoke, alcohol, illicit drugs, smoking, inhalants, sugar.   Offered to assist with any use disorders or addictions.   Injury prevention: Discussed safety belts, safety helmets, smoke  detectors. Cancer Screening: Penile cancer screening: Asked about genital warts or tumors/abnormalities of penis. Not interested in Gardasil at this time. Testicular cancer screening:  Patient was advised to palpate testicles, scrotum, penis for masses and inform me of any.   Thyroid cancer screening: patient advised to check by palpating thyroid for nodules Prostate cancer screening:  Denies family history of prostate cancer or hematospermia so too young for screening by current guidelines. No results found for: "PSA"    also denies prostate pain - doesn't know family history as adopted. Colon cancer screening:  adopted, not aware of strong family history of colon cancer or blood in stool so no screening is indicated until age 45.   Lung cancer screening:  current guidelines recommend Individuals aged 90 to 78 who currently smoke or formerly smoked and have a ? 20 pack-year smoking history should undergo annual screening with low-dose computed tomography (LDCT).  He reports no cough, shortness of breath,or hemoptysis. Skin cancer screening-  Advised regular sunscreen use. He denies worrisome, changing, or new skin lesions. Showed him pictures of melanomas for reference:   Return to care in 1 year for next preventative visit.     Subjective:   Discussed the use of AI scribe software for clinical note transcription with the patient, who gave verbal consent to proceed.  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 He has a history of tobacco use, with a pack-a-day habit since the age of 67, although he reports a recent reduction in consumption. He has tried various cessation aids, including patches and a device called a "Fume," which delivers essential oils. The patient expresses a desire to quit smoking entirely.   He has been sober for several years and is currently not on any medications. He previously used melatonin for  sleep but discontinued due to concerns about dependency. Now he takes no medication(s) at all  The patient has health insurance coverage for the past two years, following a period of being uninsured. He has a physically active job and engages in regular stretching and some weight lifting for exercise. He reports occasional difficulty falling asleep but denies frequent awakenings or daytime fatigue. He has a family history of sleep apnea but does not believe he has the condition.  The patient's diet includes occasional consumption of high-sugar beverages and starchy foods, which he acknowledges may be contributing to his weight. He expresses a willingness to make dietary changes for health improvement.  The patient reports no current symptoms of concern, such as cough, shortness of breath, or hemoptysis. He has no known family history of early-onset colon cancer or prostate cancer. He has noticed no lumps or bumps on his skin that would suggest skin cancer.  In summary, the patient presents for a routine physical and vaccination, with ongoing heavy tobacco use. He has a physically active lifestyle but acknowledges the need for dietary improvements. He reports no current symptoms of concern.  He has no acute complaints today.  Review of Systems  Constitutional:  Negative for chills, diaphoresis, fever, malaise/fatigue and weight loss.  HENT:  Negative for congestion, ear discharge, ear pain, hearing loss, nosebleeds, sinus pain, sore throat and tinnitus.   Eyes:  Negative for blurred vision, double vision, photophobia, pain, discharge and redness.  Respiratory:  Negative for cough, hemoptysis, sputum production, shortness of breath, wheezing and stridor.   Cardiovascular:  Negative for chest pain, palpitations, orthopnea, claudication, leg swelling and PND.  Gastrointestinal:  Negative for abdominal pain, blood in stool, constipation, diarrhea, heartburn, melena, nausea and vomiting.   Genitourinary:  Negative for dysuria, flank pain, frequency, hematuria and urgency.  Musculoskeletal:  Negative for back pain, falls, joint pain, myalgias and neck pain.  Skin:  Negative for itching and rash.  Neurological:  Negative for dizziness, tingling, tremors, sensory change, speech change, focal weakness, seizures, loss of consciousness, weakness and headaches.  Endo/Heme/Allergies:  Negative for environmental allergies and polydipsia. Does not bruise/bleed easily.  Psychiatric/Behavioral:  Negative for depression, hallucinations, memory loss, substance abuse and suicidal ideas. The patient is not nervous/anxious and does not have insomnia.    I attest that I have reviewed and confirmed the patients current medications to meet the medication reconciliation requirement Used to do melatonin now no medications    The following were reviewed and entered/updated in epic:    05/02/2023   10:11 AM  Depression screen PHQ 2/9  Decreased Interest 1  Down, Depressed, Hopeless 0  PHQ - 2 Score 1  Altered sleeping 1  Tired, decreased energy 1  Change in appetite 1  Feeling bad or failure about yourself  0  Trouble concentrating 0  Moving slowly or fidgety/restless 0  Suicidal thoughts 0  PHQ-9 Score 4  Difficult doing work/chores Not difficult at all   History reviewed. No pertinent past medical history. Patient Active Problem List   Diagnosis Date Noted   Obesity (BMI 30-39.9) 05/02/2023   Weight disorder 05/02/2023   Past Surgical History:  Procedure Laterality Date   HERNIA REPAIR     Family History  Adopted: Yes   No Known Allergies Social History   Tobacco Use   Smoking status: Every Day    Types: Cigarettes   Smokeless tobacco: Never  Substance Use Topics   Alcohol use: Yes    Alcohol/week: 12.0 standard drinks of alcohol    Types: 12 Cans of beer per week   Drug use: Yes    Types: Marijuana        Objective:  Physical Exam: BP 126/88 (BP Location: Left  Arm, Patient Position: Sitting)   Pulse 79   Temp 98.2 F (36.8 C) (Temporal)   Ht 5\' 10"  (1.778 m)   Wt 220 lb 12.8 oz (100.2 kg)   SpO2 97%   BMI 31.68 kg/m   Body mass index is 31.68 kg/m. Wt Readings from Last 3 Encounters:  05/02/23 220 lb 12.8 oz (100.2 kg)  05/01/15 160 lb (72.6 kg)   GEN: NAD, resting comfortably HEENT: Tympanic membranes normal appearing bilaterally, Oropharynx clear. No Thyromegaly noted. No palpable lymphadenopathy or thyroid nodules. CARDIOVASCULAR: S1 and S2 heart sounds have regular rate and rhythm with no murmurs appreciated PULMONARY:  Normal work of breathing. Clear to auscultation bilaterally with no crackles, wheezes, or rhonchi ABDOMEN: Soft, Nontender, Nondistended.  MSK: No edema, cyanosis, or clubbing noted  SKIN: Warm, dry, no lesions of concern observed NEURO: CN2-12 grossly intact. Strength 5/5 in upper and lower extremities.  Reflexes symmetric and intact bilaterally.  PSYCH: Normal affect and thought content, pleasant and cooperative.  Photographs Taken 05/02/2023 :      To follow skin lesions on back, none concerning.

## 2023-05-03 ENCOUNTER — Encounter: Payer: Self-pay | Admitting: Internal Medicine

## 2023-05-03 DIAGNOSIS — D72829 Elevated white blood cell count, unspecified: Secondary | ICD-10-CM | POA: Insufficient documentation

## 2023-05-03 NOTE — Progress Notes (Signed)
Key Points for Patient Follow-Up Call:  Test Results Summary: - Comprehensive Metabolic Panel, Lipid Panel, CBC, HbA1c, and TSH performed recently - Key findings: Slightly elevated fasting glucose, high triglycerides, and elevated white blood cell count  Clinical Interpretation: - Most results normal, including liver function, kidney function, and thyroid function - Fasting glucose (105 mg/dL) indicates possible prediabetes risk - Triglycerides high at 244 mg/dL, increasing cardiovascular risk - Elevated WBC (13.2 K/uL) may indicate infection or inflammation  Patient Instructions: - No immediate medication changes - Lifestyle recommendations: Reduce sugar and refined carb intake, increase exercise, manage weight, quit smoking - Follow-up: Schedule appointment in 3 months for blood sugar recheck  Addressing Patient Concerns: - If patient asks about infection risk: "The elevated white blood cell count could be due to a minor infection or inflammation. If you're experiencing any symptoms like fever or persistent cough, please let us know immediately." - If patient asks about smoking cessation: "Dr. Jon Billings can discuss various strategies and resources to help you quit smoking at your next visit."  Next Steps: - Schedule follow-up appointment in 3 months - Remind patient to implement lifestyle changes - Inquire about any current symptoms of infection  Your role in communicating this information is crucial. If you encounter complex questions or if the patient seems anxious, please don't hesitate to have them schedule an appointment with me. Thank you for your expertise in patient care.

## 2024-02-08 DIAGNOSIS — R7303 Prediabetes: Secondary | ICD-10-CM | POA: Diagnosis not present

## 2024-02-13 DIAGNOSIS — R7303 Prediabetes: Secondary | ICD-10-CM | POA: Diagnosis not present

## 2024-03-04 DIAGNOSIS — R7303 Prediabetes: Secondary | ICD-10-CM | POA: Diagnosis not present

## 2024-03-05 DIAGNOSIS — R7303 Prediabetes: Secondary | ICD-10-CM | POA: Diagnosis not present

## 2024-03-06 DIAGNOSIS — R7303 Prediabetes: Secondary | ICD-10-CM | POA: Diagnosis not present

## 2024-03-07 DIAGNOSIS — R7303 Prediabetes: Secondary | ICD-10-CM | POA: Diagnosis not present

## 2024-03-08 DIAGNOSIS — R7303 Prediabetes: Secondary | ICD-10-CM | POA: Diagnosis not present

## 2024-03-11 DIAGNOSIS — R7303 Prediabetes: Secondary | ICD-10-CM | POA: Diagnosis not present

## 2024-03-12 DIAGNOSIS — R7303 Prediabetes: Secondary | ICD-10-CM | POA: Diagnosis not present

## 2024-03-13 DIAGNOSIS — R7303 Prediabetes: Secondary | ICD-10-CM | POA: Diagnosis not present

## 2024-03-14 DIAGNOSIS — R7303 Prediabetes: Secondary | ICD-10-CM | POA: Diagnosis not present

## 2024-03-15 DIAGNOSIS — R7303 Prediabetes: Secondary | ICD-10-CM | POA: Diagnosis not present
